# Patient Record
Sex: Female | Born: 1985 | Race: Black or African American | Hispanic: No | Marital: Single | Smoking: Never smoker
Health system: Southern US, Community
[De-identification: ages and names within clinical notes are randomized; demographics above are authoritative.]

## PROBLEM LIST (undated history)

## (undated) DIAGNOSIS — O24419 Gestational diabetes mellitus in pregnancy, unspecified control: Secondary | ICD-10-CM

---
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
## 2006-03-10 ENCOUNTER — Encounter: Admission: RE | Admit: 2006-03-10 | Discharge: 2006-03-10 | Payer: Self-pay | Admitting: Obstetrics

## 2011-10-26 HISTORY — PX: BREAST REDUCTION SURGERY: SHX8

## 2015-12-19 ENCOUNTER — Other Ambulatory Visit: Payer: Self-pay | Admitting: Obstetrics and Gynecology

## 2015-12-19 DIAGNOSIS — N63 Unspecified lump in unspecified breast: Secondary | ICD-10-CM

## 2015-12-25 ENCOUNTER — Ambulatory Visit
Admission: RE | Admit: 2015-12-25 | Discharge: 2015-12-25 | Disposition: A | Discharge status: Home / Self Care | Payer: BLUE CROSS/BLUE SHIELD | Source: Ambulatory Visit | Attending: Obstetrics and Gynecology | Admitting: Obstetrics and Gynecology

## 2015-12-25 ENCOUNTER — Other Ambulatory Visit: Payer: Self-pay | Admitting: Obstetrics and Gynecology

## 2015-12-25 DIAGNOSIS — N63 Unspecified lump in unspecified breast: Secondary | ICD-10-CM

## 2016-01-01 ENCOUNTER — Other Ambulatory Visit: Payer: BLUE CROSS/BLUE SHIELD

## 2016-01-01 ENCOUNTER — Ambulatory Visit
Admission: RE | Admit: 2016-01-01 | Discharge: 2016-01-01 | Disposition: A | Discharge status: Home / Self Care | Payer: BLUE CROSS/BLUE SHIELD | Source: Ambulatory Visit | Attending: Obstetrics and Gynecology | Admitting: Obstetrics and Gynecology

## 2016-01-01 ENCOUNTER — Other Ambulatory Visit: Payer: Self-pay | Admitting: Obstetrics and Gynecology

## 2016-01-01 DIAGNOSIS — N63 Unspecified lump in unspecified breast: Secondary | ICD-10-CM

## 2016-02-23 ENCOUNTER — Other Ambulatory Visit: Payer: Self-pay | Admitting: General Surgery

## 2017-07-04 IMAGING — MG MM DIGITAL DIAGNOSTIC UNILAT*L*
3 series · 3 of 3 positions shown · non-contrast
Comparison: Previous exam(s).

CLINICAL DATA: Evaluate clip placement

EXAM:
DIAGNOSTIC LEFT MAMMOGRAM POST ULTRASOUND BIOPSY

[L ML]
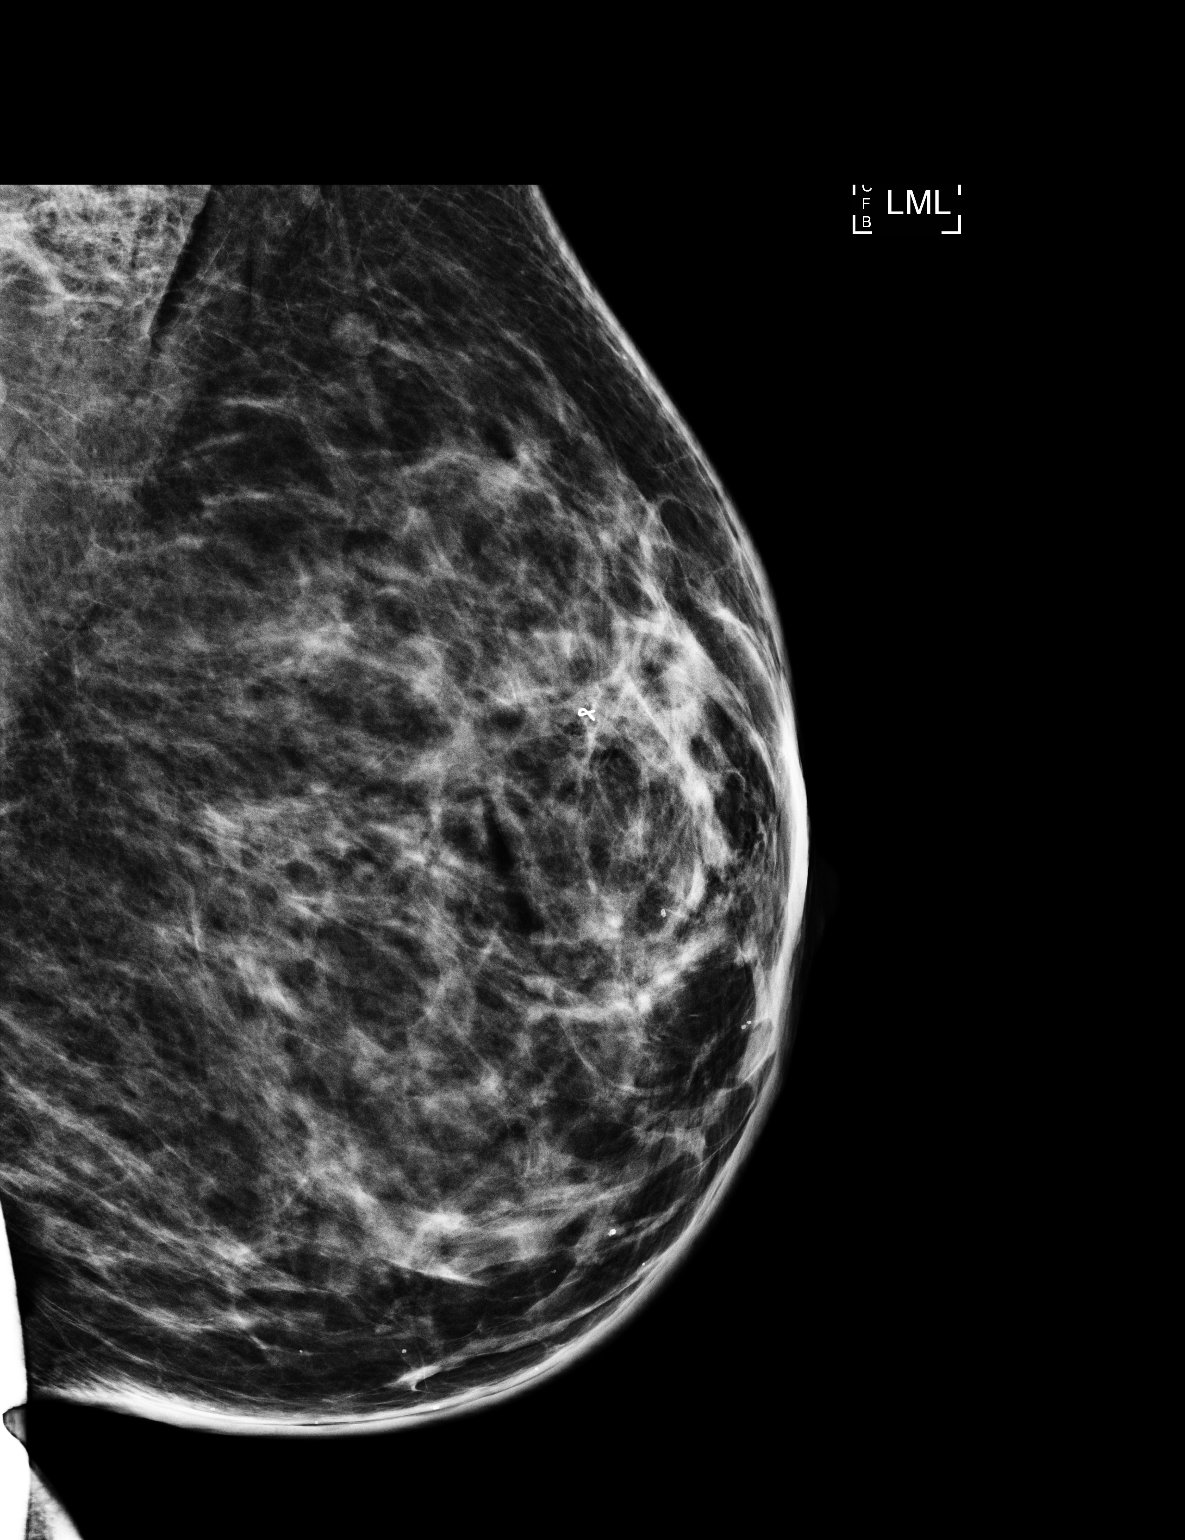

[L MLO]
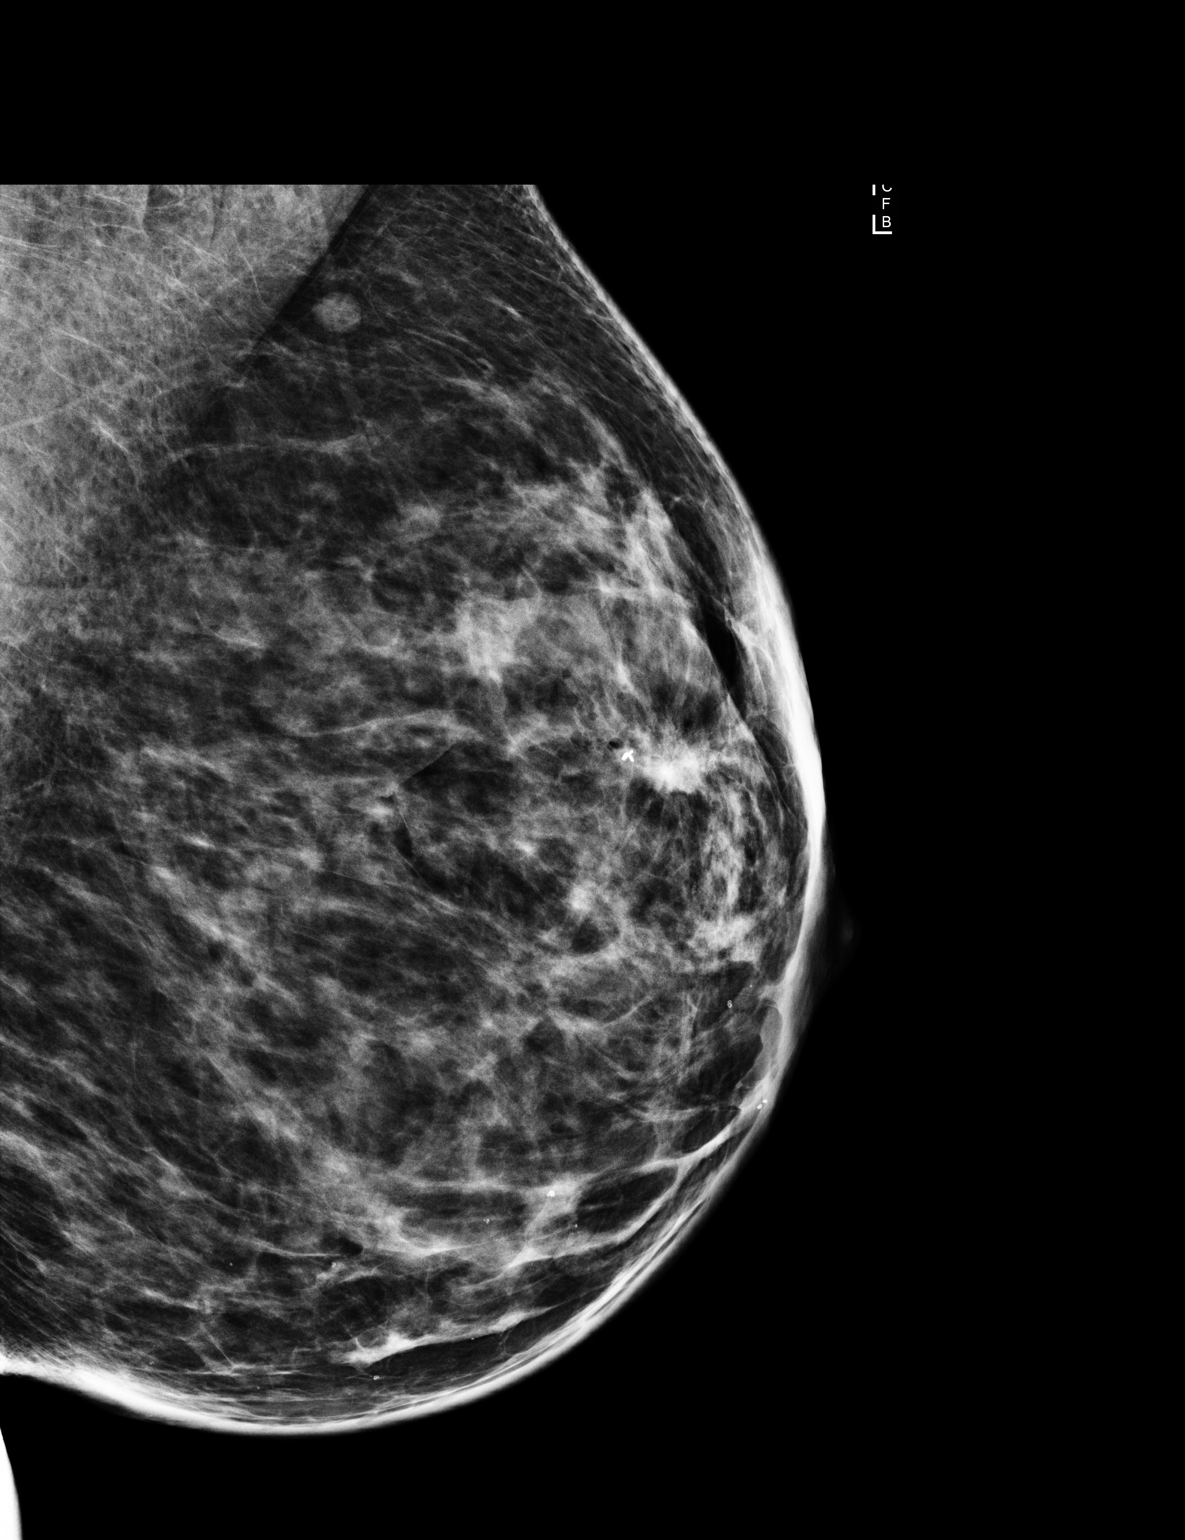

[L CC]
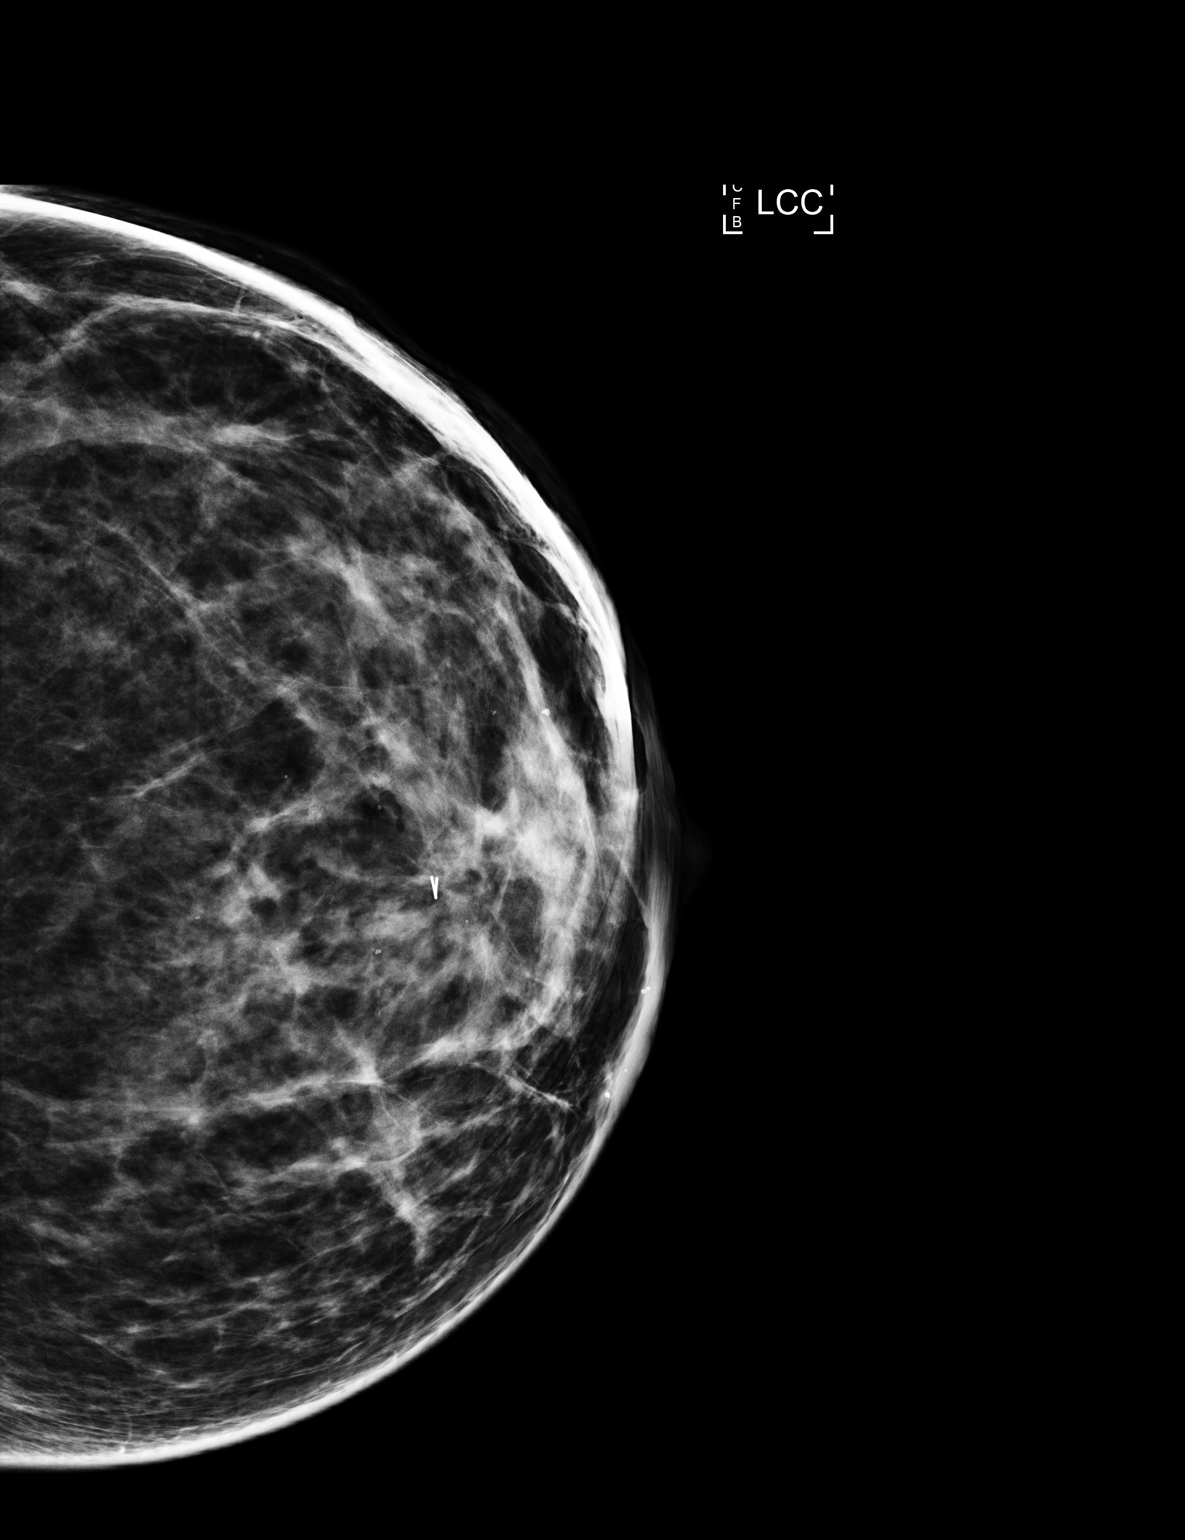

[3 of 3 positions shown; findings below may reference images not displayed]

FINDINGS: Mammographic images were obtained following ultrasound guided biopsy
of a left breast mass with distortion and an abnormal left axillary
node.. The ribbon shaped clip is seen within the left breast mass
and distortion. The biopsied axillary node cannot be pulled into
view.
IMPRESSION: Successful clip placement within the biopsied breast mass. The
biopsied left axillary node cannot be seen on this study. Under
ultrasound guidance, the HydroMARK clip is located within the
periphery of the biopsied node.

Final Assessment: Post Procedure Mammograms for Marker Placement

## 2020-03-03 ENCOUNTER — Encounter (HOSPITAL_COMMUNITY): Payer: Self-pay | Admitting: Emergency Medicine

## 2020-03-03 ENCOUNTER — Emergency Department (HOSPITAL_COMMUNITY)
Admission: EM | Admit: 2020-03-03 | Discharge: 2020-03-04 | Disposition: A | Discharge status: Home / Self Care | Payer: BC Managed Care – PPO | Attending: Emergency Medicine | Admitting: Emergency Medicine

## 2020-03-03 DIAGNOSIS — R112 Nausea with vomiting, unspecified: Secondary | ICD-10-CM | POA: Diagnosis not present

## 2020-03-03 DIAGNOSIS — Z79899 Other long term (current) drug therapy: Secondary | ICD-10-CM | POA: Diagnosis not present

## 2020-03-03 DIAGNOSIS — R1013 Epigastric pain: Secondary | ICD-10-CM | POA: Insufficient documentation

## 2020-03-03 DIAGNOSIS — R197 Diarrhea, unspecified: Secondary | ICD-10-CM | POA: Diagnosis not present

## 2020-03-03 DIAGNOSIS — R109 Unspecified abdominal pain: Secondary | ICD-10-CM

## 2020-03-03 LAB — URINALYSIS, ROUTINE W REFLEX MICROSCOPIC
Bilirubin Urine: NEGATIVE
Glucose, UA: NEGATIVE mg/dL
Hgb urine dipstick: NEGATIVE
Ketones, ur: 80 mg/dL — AB
Leukocytes,Ua: NEGATIVE
Nitrite: NEGATIVE
Protein, ur: 100 mg/dL — AB
Specific Gravity, Urine: 1.027 (ref 1.005–1.030)
pH: 5 (ref 5.0–8.0)

## 2020-03-03 MED ORDER — SODIUM CHLORIDE 0.9% FLUSH: 3.0000 mL | Freq: Once | INTRAVENOUS | Status: DC

## 2020-03-03 MED ORDER — FAMOTIDINE IN NACL 20-0.9 MG/50ML-% IV SOLN
20.0000 mg | Freq: Once | INTRAVENOUS | Status: AC
  Administered 2020-03-04: 20 mg via INTRAVENOUS
  Filled 2020-03-03: qty 50

## 2020-03-03 MED ORDER — ONDANSETRON HCL 4 MG/2ML IJ SOLN
4.0000 mg | Freq: Once | INTRAMUSCULAR | Status: AC
Start: 2020-03-03 — End: 2020-03-04
  Administered 2020-03-04: 01:00:00 4 mg via INTRAVENOUS
  Filled 2020-03-03: qty 2

## 2020-03-03 MED ORDER — SODIUM CHLORIDE 0.9 % IV BOLUS
1000.0000 mL | Freq: Once | INTRAVENOUS | Status: AC
  Administered 2020-03-04: 01:00:00 1000 mL via INTRAVENOUS

## 2020-03-03 NOTE — ED Triage Notes (Signed)
Pt states she woke up this am with epigastric to mid abd pain that started at 3am- Pt did eat out the night before. Pt has had n/v/d. Pt reports 4 episodes of diarrhea and 4 episodes of emesis.

## 2020-03-04 ENCOUNTER — Encounter (HOSPITAL_COMMUNITY): Payer: Self-pay | Admitting: Student

## 2020-03-04 ENCOUNTER — Emergency Department (HOSPITAL_COMMUNITY): Payer: BC Managed Care – PPO

## 2020-03-04 LAB — CBC
HCT: 36.2 % (ref 36.0–46.0)
Hemoglobin: 11.3 g/dL — ABNORMAL LOW (ref 12.0–15.0)
MCH: 24.9 pg — ABNORMAL LOW (ref 26.0–34.0)
MCHC: 31.2 g/dL (ref 30.0–36.0)
MCV: 79.9 fL — ABNORMAL LOW (ref 80.0–100.0)
Platelets: 269 10*3/uL (ref 150–400)
RBC: 4.53 MIL/uL (ref 3.87–5.11)
RDW: 18.5 % — ABNORMAL HIGH (ref 11.5–15.5)
WBC: 12 10*3/uL — ABNORMAL HIGH (ref 4.0–10.5)
nRBC: 0 % (ref 0.0–0.2)

## 2020-03-04 LAB — COMPREHENSIVE METABOLIC PANEL
ALT: 20 U/L (ref 0–44)
AST: 26 U/L (ref 15–41)
Albumin: 4.5 g/dL (ref 3.5–5.0)
Alkaline Phosphatase: 58 U/L (ref 38–126)
Anion gap: 11 (ref 5–15)
BUN: 12 mg/dL (ref 6–20)
CO2: 23 mmol/L (ref 22–32)
Calcium: 9.8 mg/dL (ref 8.9–10.3)
Chloride: 105 mmol/L (ref 98–111)
Creatinine, Ser: 0.85 mg/dL (ref 0.44–1.00)
GFR calc Af Amer: >60 mL/min (ref >60)
GFR calc non Af Amer: >60 mL/min (ref >60)
Glucose, Bld: 127 mg/dL — ABNORMAL HIGH (ref 70–99)
Potassium: 3.8 mmol/L (ref 3.5–5.1)
Sodium: 139 mmol/L (ref 135–145)
Total Bilirubin: 0.8 mg/dL (ref 0.3–1.2)
Total Protein: 8.1 g/dL (ref 6.5–8.1)

## 2020-03-04 LAB — I-STAT BETA HCG BLOOD, ED (MC, WL, AP ONLY): I-stat hCG, quantitative: <5 m[IU]/mL (ref <5)

## 2020-03-04 LAB — LIPASE, BLOOD: Lipase: 29 U/L (ref 11–51)

## 2020-03-04 MED ORDER — ALUM & MAG HYDROXIDE-SIMETH 200-200-20 MG/5ML PO SUSP
30.0000 mL | Freq: Once | ORAL | Status: AC
  Administered 2020-03-04: 01:00:00 30 mL via ORAL
  Filled 2020-03-04: qty 30

## 2020-03-04 MED ORDER — ONDANSETRON 4 MG PO TBDP: 4.0000 mg | ORAL_TABLET | Freq: Three times a day (TID) | ORAL | 0 refills | Status: DC | PRN

## 2020-03-04 MED ORDER — FAMOTIDINE 20 MG PO TABS: 20.0000 mg | ORAL_TABLET | Freq: Two times a day (BID) | ORAL | 0 refills | Status: DC

## 2020-03-04 MED ORDER — LIDOCAINE VISCOUS HCL 2 % MT SOLN
15.0000 mL | Freq: Once | OROMUCOSAL | Status: AC
  Administered 2020-03-04: 01:00:00 15 mL via ORAL
  Filled 2020-03-04: qty 15

## 2020-03-04 MED ORDER — SUCRALFATE 1 GM/10ML PO SUSP: 1.0000 g | Freq: Three times a day (TID) | ORAL | 0 refills | Status: DC

## 2020-03-04 NOTE — Discharge Instructions (Addendum)
You were seen in the emergency department today for abdominal pain with nausea vomiting and diarrhea.  Your work-up was overall reassuring.  Your urine did show that you are dehydrated, please be sure to drink plenty of fluids.  Your ultrasound of your gallbladder was normal.  We suspect your symptoms are likely from a viral illness.  We are sending you home with the following medicines: -Zofran: Take every 8 hours as needed for nausea and vomiting -Pepcid: Take once in the morning and once in the evening to help with stomach acidity/discomfort -Carafate: Take prior to meals and prior to bedtime to help with stomach acidity/discomfort.  We have prescribed you new medication(s) today. Discuss the medications prescribed today with your pharmacist as they can have adverse effects and interactions with your other medicines including over the counter and prescribed medications. Seek medical evaluation if you start to experience new or abnormal symptoms after taking one of these medicines, seek care immediately if you start to experience difficulty breathing, feeling of your throat closing, facial swelling, or rash as these could be indications of a more serious allergic reaction  Please follow attached diet guidelines.  Please follow-up with your primary care provider within 3 days.  Return to the ER for new or worsening symptoms including but not limited to ability to keep fluids down, blood in vomit or stool, worsening pain, or any other concerns.

## 2020-03-04 NOTE — ED Provider Notes (Signed)
[facility] EMERGENCY DEPARTMENT Provider Note   CSN: [id number] Arrival date & time: 03/03/20  1558     History Chief Complaint  Patient presents with  . Abdominal Pain    Jordan Avery is a 34 y.o. female without significant past medical hx who presents to the ED with complaints of abdominal pain that began @ 0200 this AM. Patient reports pain is to the upper abdomen, primarily in the epigastrium, feels like a burning discomfort that has been intermittent since onset. Has had associated chills & N/V/D, with about 3-4 episodes of both emesis & diarrhea, no blood has been noted. She states she feels a bit better with belching. No other specific alleviating/aggravating factors. She was seen at [facility] and there was concern about her gallbladder therefore she was sent to the ED. Denies fever, hematemesis, melena, hematochezia, dysuria, vaginal discharge, or dyspnea. No sick contacts with similar sxs.   HPI     History reviewed. No pertinent past medical history.  There are no problems to display for this patient.   History reviewed. No pertinent surgical history.   OB History   No obstetric history on file.     No family history on file.  Social History   Tobacco Use  . Smoking status: Not on file  Substance Use Topics  . Alcohol use: Not on file  . Drug use: Not on file    Home Medications Prior to Admission medications   Medication Sig Start Date End Date Taking? Authorizing Provider  ibuprofen (ADVIL) 200 MG tablet Take 800 mg by mouth daily as needed for cramping.   Yes [provider]  loratadine (CLARITIN) 10 MG tablet Take 10 mg by mouth daily as needed for allergies or rhinitis.   Yes [provider]  Multiple Vitamin (MULTIVITAMIN WITH MINERALS) TABS tablet Take 1 tablet by mouth daily.   Yes [provider]  tetrahydrozoline 0.05 % ophthalmic solution Place 2 drops into both eyes daily as needed (Dry eyes).   Yes  [provider]    Allergies    Patient has no known allergies.  Review of Systems   Review of Systems  Constitutional: Positive for chills. Negative for fever.  Respiratory: Negative for shortness of breath.   Gastrointestinal: Positive for abdominal pain, diarrhea, nausea and vomiting. Negative for anal bleeding, blood in stool and constipation.  Genitourinary: Negative for dysuria, vaginal bleeding and vaginal discharge.  Neurological: Negative for syncope.  All other systems reviewed and are negative.   Physical Exam Updated Vital Signs BP 130/79 (BP Location: Right Arm)   Pulse 68   Temp 98.4 F (36.9 C) (Oral)   Resp 18   SpO2 98%   Physical Exam Vitals and nursing note reviewed.  Constitutional:      General: She is not in acute distress.    Appearance: She is well-developed. She is not toxic-appearing.  HENT:     Head: Normocephalic and atraumatic.  Eyes:     General:        Right eye: No discharge.        Left eye: No discharge.     Conjunctiva/sclera: Conjunctivae normal.  Cardiovascular:     Rate and Rhythm: Normal rate and regular rhythm.  Pulmonary:     Effort: Pulmonary effort is normal. No respiratory distress.     Breath sounds: Normal breath sounds. No wheezing, rhonchi or rales.  Abdominal:     General: There is no distension.  Palpations: Abdomen is soft.     Tenderness: There is abdominal tenderness (upper abdomen, most prominent in the epigastrium). There is no guarding or rebound.  Musculoskeletal:     Cervical back: Neck supple.  Skin:    General: Skin is warm and dry.     Findings: No rash.  Neurological:     Mental Status: She is alert.     Comments: Clear speech.   Psychiatric:        Behavior: Behavior normal.     ED Results / Procedures / Treatments   Labs (all labs ordered are listed, but only abnormal results are displayed) Labs Reviewed  CBC - Abnormal; Notable for the following components:      Result Value    WBC 12.0 (*)    Hemoglobin 11.3 (*)    MCV 79.9 (*)    MCH 24.9 (*)    RDW 18.5 (*)    All other components within normal limits  URINALYSIS, ROUTINE W REFLEX MICROSCOPIC - Abnormal; Notable for the following components:   APPearance HAZY (*)    Ketones, ur 80 (*)    Protein, ur 100 (*)    Bacteria, UA RARE (*)    All other components within normal limits  LIPASE, BLOOD  COMPREHENSIVE METABOLIC PANEL  I-STAT BETA HCG BLOOD, ED ([facility], [facility], [facility] ONLY)    EKG None  Radiology US Abdomen Limited RUQ  Result Date: 03/04/2020 CLINICAL DATA:  Right upper quadrant and epigastric pain for for for for EXAM: ULTRASOUND ABDOMEN LIMITED RIGHT UPPER QUADRANT COMPARISON:  None. FINDINGS: Gallbladder: No gallstones or wall thickening visualized. No sonographic Murphy sign noted by sonographer. Common bile duct: Diameter: 2.3 mm Liver: No focal lesion identified. Within normal limits in parenchymal echogenicity. Portal vein is patent on color Doppler imaging with normal direction of blood flow towards the liver. Other: None. IMPRESSION: Normal right upper quadrant ultrasound Electronically Signed   By: Jonna Clark M.D.   On: 03/04/2020 00:30    Procedures Procedures (including critical care time)  Medications Ordered in ED Medications  sodium chloride flush (NS) 0.9 % injection 3 mL (has no administration in time range)  sodium chloride 0.9 % bolus 1,000 mL (has no administration in time range)  ondansetron (ZOFRAN) injection 4 mg (has no administration in time range)  famotidine (PEPCID) IVPB 20 mg premix (has no administration in time range)    ED Course  I have reviewed the triage vital signs and the nursing notes.  Pertinent labs & imaging results that were available during my care of the patient were reviewed by me and considered in my medical decision making (see chart for details).     Jordan Avery was evaluated in Emergency Department on 03/04/2020 for the symptoms described in the  history of present illness. He/she was evaluated in the context of the global COVID-19 pandemic, which necessitated consideration that the patient might be at risk for infection with the SARS-CoV-2 virus that causes COVID-19. Institutional protocols and algorithms that pertain to the evaluation of patients at risk for COVID-19 are in a state of rapid change based on information released by regulatory bodies including the CDC and federal and state organizations. These policies and algorithms were followed during the patient's care in the ED.  MDM Rules/Calculators/A&P                     Patient presents to the ED with concern for abdominal discomfort with N/V/D. Patient is nontoxic,  in no apparent distress, vitals WNL. On exam she has some upper abdominal tenderness without peritoneal signs. DDX: GERD, PUD, cholecystitis, cholelithiasis, pancreatitis, viral GI illness, diverticulitis, colitis, obstruction, perforation.  Additional history obtained:  Additional history obtained from review of nursing notes and prior records.  Lab Tests:  I Ordered, reviewed, and interpreted labs, which included:  CBC: Mild leukocytosis at 12.0.  Mild anemia with hemoglobin of 11.3-most recently 11.1 therefore similar to prior. CMP: Renal function & LFTs WNL. No significant electrolyte derangement.  Lipase: WNL Pregnancy test: Negative UA: ketonuria concerning for dehydration, no UTI.   Imaging Studies ordered:  I ordered imaging studies which included RUQ [place], I independently visualized and interpreted imaging which showed no acute process.   ED Course:  On initial evaluation patient given fluids, Zofran, and Pepcid for symptomatic management. Following reassuring work-up and ultrasound was given GI cocktail.  01:55: RE-EVAL: Patient states she is feeling much better, she is tolerating p.o., her repeat abdominal exam remains without peritoneal signs, do not suspect acute surgical process.  Overall suspicion is  for viral GI illness with potentially a component of GERD/PUD.  Will discharge home with supportive care and diet recommendations with PCP follow-up. I discussed results, treatment plan, need for follow-up, and return precautions with the patient. Provided opportunity for questions, patient confirmed understanding and is in agreement with plan.   Portions of this note were generated with Scientist, clinical (histocompatibility and immunogenetics). Dictation errors may occur despite best attempts at proofreading.  Final Clinical Impression(s) / ED Diagnoses Final diagnoses:  Abdominal pain  Nausea vomiting and diarrhea    Rx / DC Orders ED Discharge Orders         Ordered    ondansetron (ZOFRAN ODT) 4 MG disintegrating tablet  Every 8 hours PRN     03/04/20 0200    famotidine (PEPCID) 20 MG tablet  2 times daily     03/04/20 0200    sucralfate (CARAFATE) 1 GM/10ML suspension  3 times daily with meals & bedtime     03/04/20 0200           Petrucelli, Pleas Koch, PA-C 03/04/20 0202    Zadie Rhine, MD 03/04/20 [phone]

## 2020-10-25 NOTE — L&D Delivery Note (Signed)
Delivery Note   Pt was examined and found to be a right rim.  FHR 80-100 but not totally recovering so pt instructed to push and was able to reduce the rim and bring vertex to +2 in one push.  She then pushed well for about 5 minutes and was able to deliver the vertex.  A mild shoulder dystocia was then encountered and resolve in 50 seconds with McRoberts and rotating the posterior shoulder in corkscrew maneuver.  At 11:27 AM a healthy female was delivered via Vaginal, Spontaneous (Presentation:  ROA).  APGAR:8 , 9; weight pending.   Placenta status: Spontaneous, Intact.  Cord: 3 vessels with the following complications: cord present by chest.  Baby moving both arms well.    Anesthesia: Epidural Episiotomy:  none Lacerations:  none Suture Repair:  N/A Est. Blood Loss (mL):   Mom to postpartum.  Baby to Couplet care / Skin to Skin.  D/w pt and they desire circumcision.  Oliver Pila 09/12/2021, 11:42 AM

## 2021-02-19 LAB — OB RESULTS CONSOLE HIV ANTIBODY (ROUTINE TESTING): HIV: NONREACTIVE

## 2021-02-19 LAB — OB RESULTS CONSOLE RPR: RPR: NONREACTIVE

## 2021-02-19 LAB — OB RESULTS CONSOLE VARICELLA ZOSTER ANTIBODY, IGG: Varicella: NON-IMMUNE/NOT IMMUNE

## 2021-02-19 LAB — HEPATITIS C ANTIBODY: HCV Ab: NEGATIVE

## 2021-02-19 LAB — OB RESULTS CONSOLE HEPATITIS B SURFACE ANTIGEN: Hepatitis B Surface Ag: NEGATIVE

## 2021-02-19 LAB — OB RESULTS CONSOLE RUBELLA ANTIBODY, IGM: Rubella: IMMUNE

## 2021-02-19 LAB — OB RESULTS CONSOLE GC/CHLAMYDIA
Chlamydia: NEGATIVE
Gonorrhea: NEGATIVE

## 2021-07-15 ENCOUNTER — Other Ambulatory Visit: Payer: Self-pay

## 2021-07-15 ENCOUNTER — Encounter: Payer: BC Managed Care – PPO | Attending: Obstetrics and Gynecology | Admitting: Registered"

## 2021-07-15 ENCOUNTER — Encounter: Payer: Self-pay | Admitting: Registered"

## 2021-07-15 DIAGNOSIS — O24419 Gestational diabetes mellitus in pregnancy, unspecified control: Secondary | ICD-10-CM | POA: Diagnosis not present

## 2021-07-15 NOTE — Progress Notes (Signed)
Patient was seen on 07/15/21 for Gestational Diabetes self-management class at [facility]. The following learning objectives were met by the patient during this course:  States the definition of Gestational Diabetes States why dietary management is important in controlling blood glucose Describes the effects each nutrient has on blood glucose levels Demonstrates ability to create a balanced meal plan Demonstrates carbohydrate counting  States when to check blood glucose levels Demonstrates proper blood glucose monitoring techniques States the effect of stress and exercise on blood glucose levels States the importance of limiting caffeine and abstaining from alcohol and smoking  Blood glucose monitor given: Patient has meter and is checking blood sugar prior to class   Patient instructed to monitor glucose levels: FBS: 60 - <95; 1 hour: <140; 2 hour: <120  Patient received handouts: Nutrition Diabetes and Pregnancy, including carb counting list  Patient will be seen for follow-up as needed.

## 2021-08-27 LAB — OB RESULTS CONSOLE GBS: GBS: NEGATIVE

## 2021-09-05 IMAGING — US US ABDOMEN LIMITED
1 series · 14 of 25 positions shown · non-contrast
Comparison: None.

CLINICAL DATA: Right upper quadrant and epigastric pain for for for
for

EXAM:
ULTRASOUND ABDOMEN LIMITED RIGHT UPPER QUADRANT

[Series 1: us abdomen limited ruq · 14 of 44 slices shown]
[im 1/44]
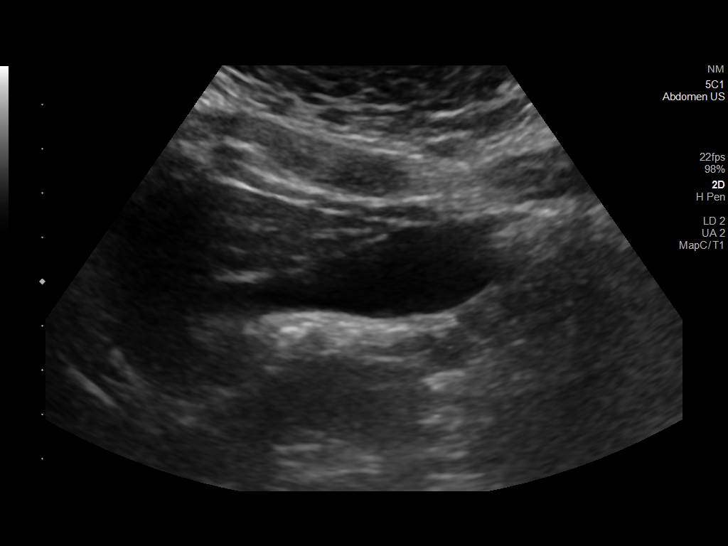
[im 4/44]
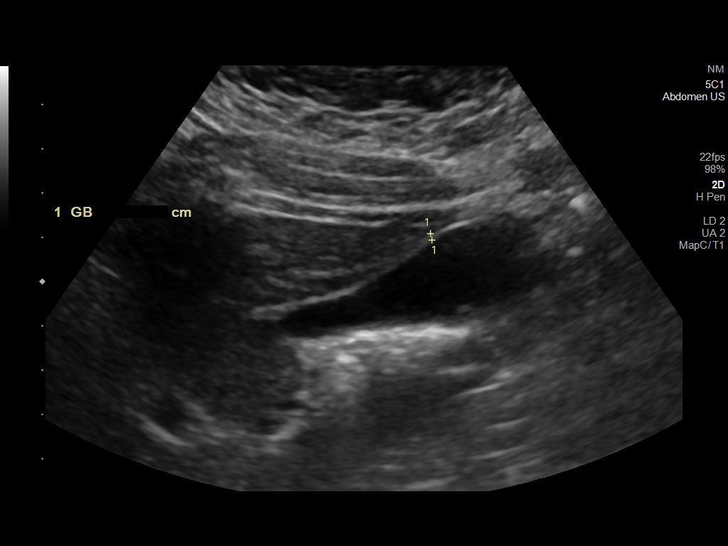
[im 8/44]
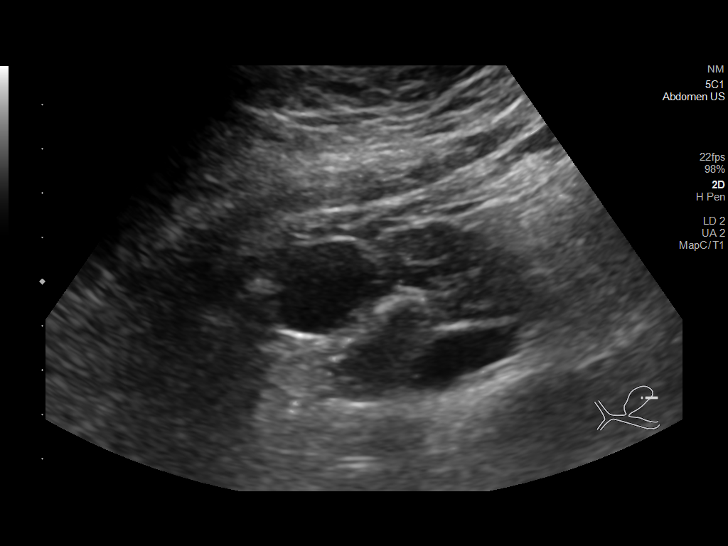
[im 11/44]
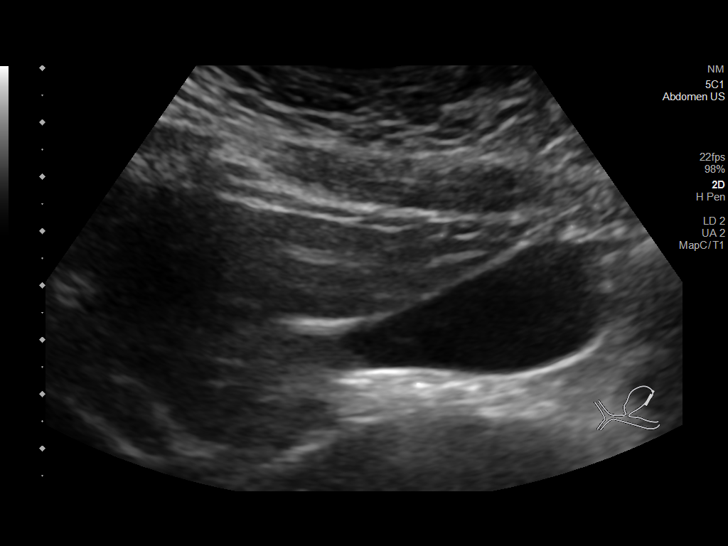
[im 15/44]
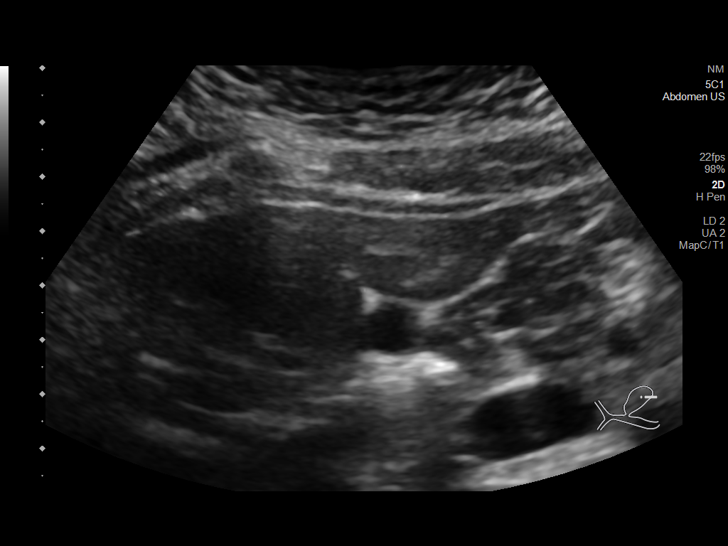
[im 17/44]
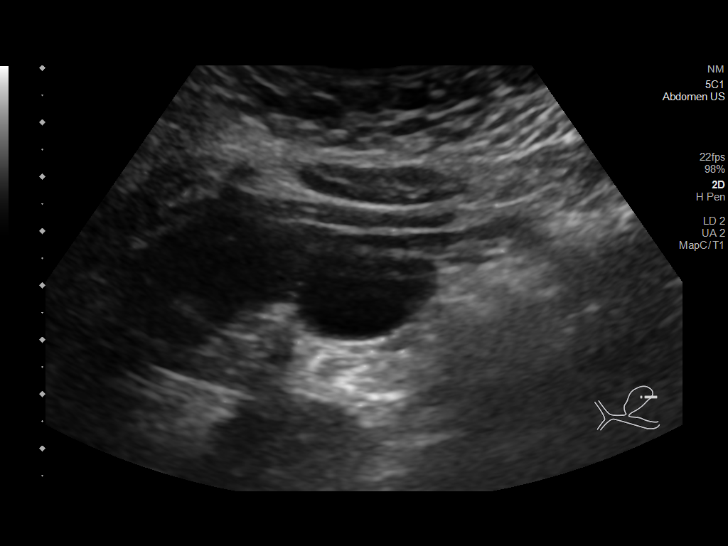
[im 20/44]
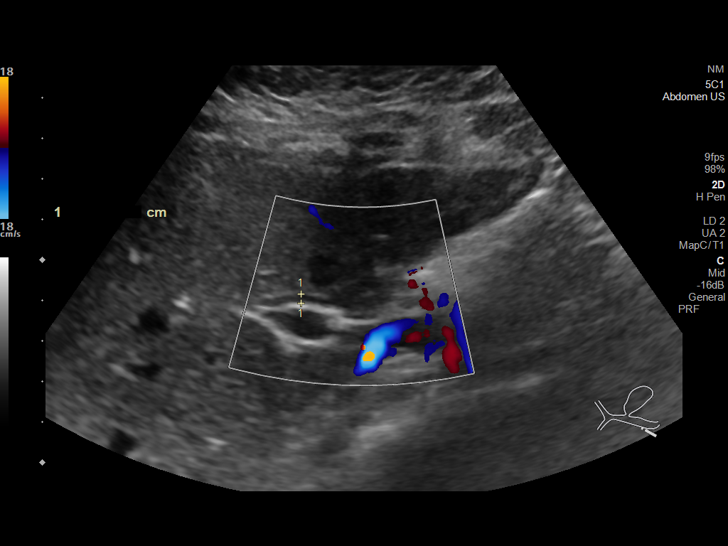
[im 24/44]
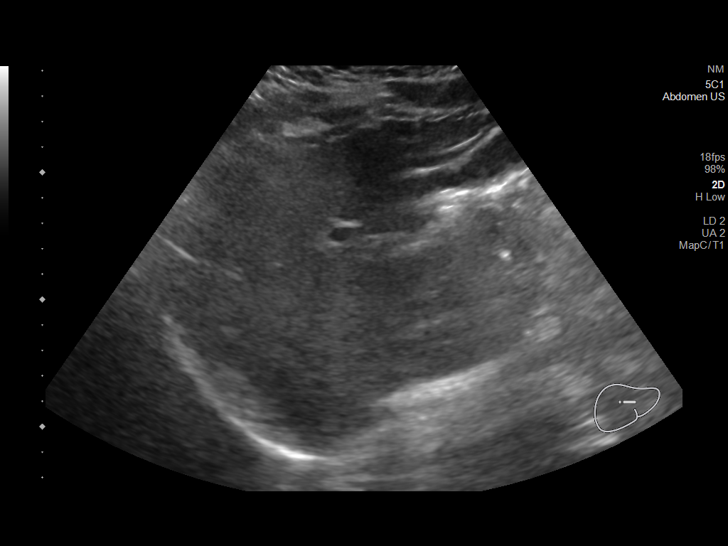
[im 27/44]
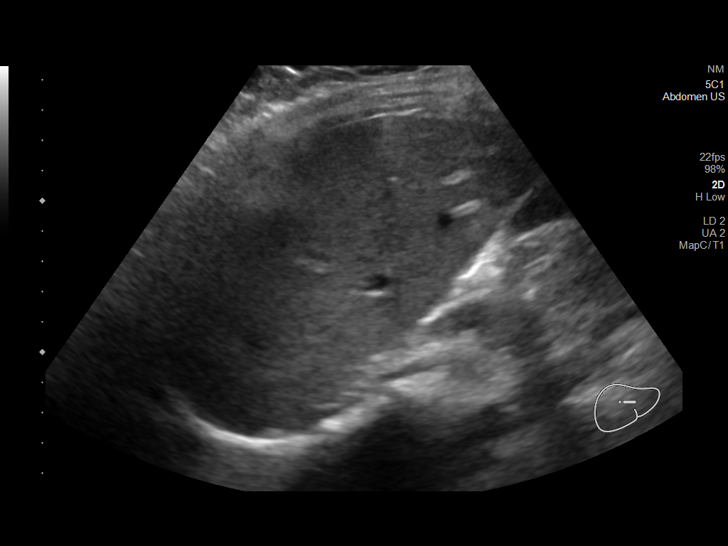
[im 29/44]
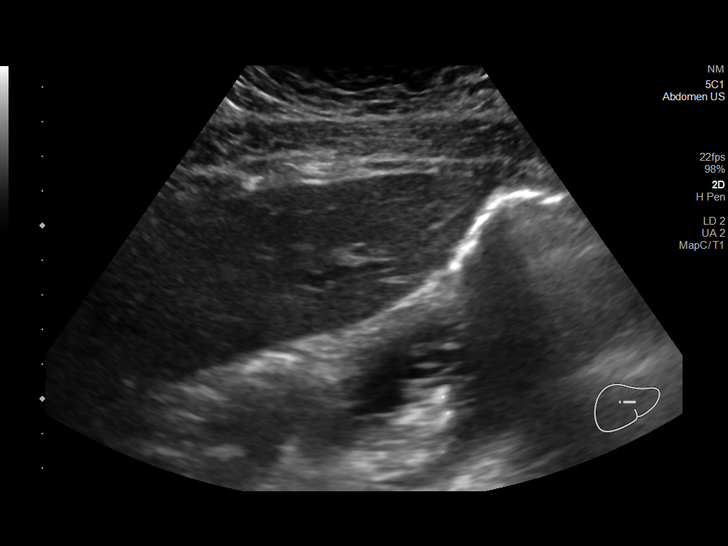
[im 33/44]
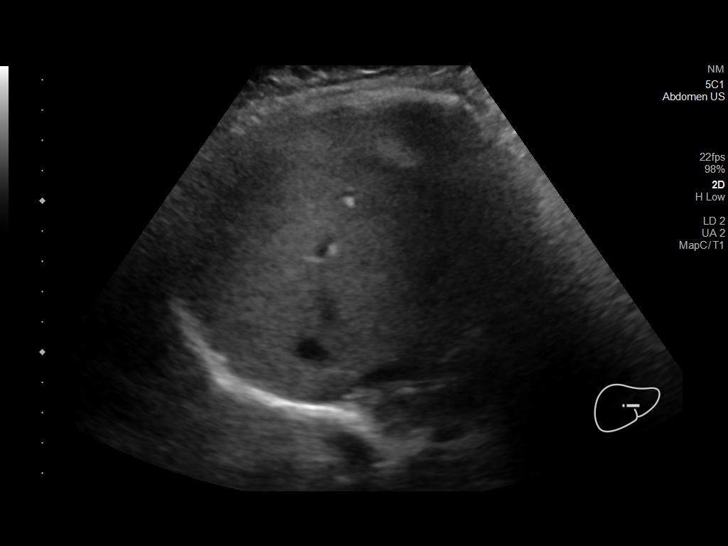
[im 36/44]
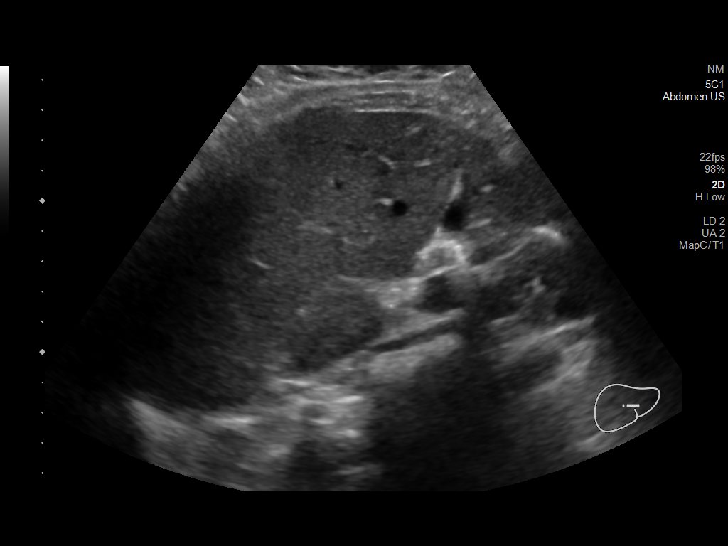
[im 40/44]
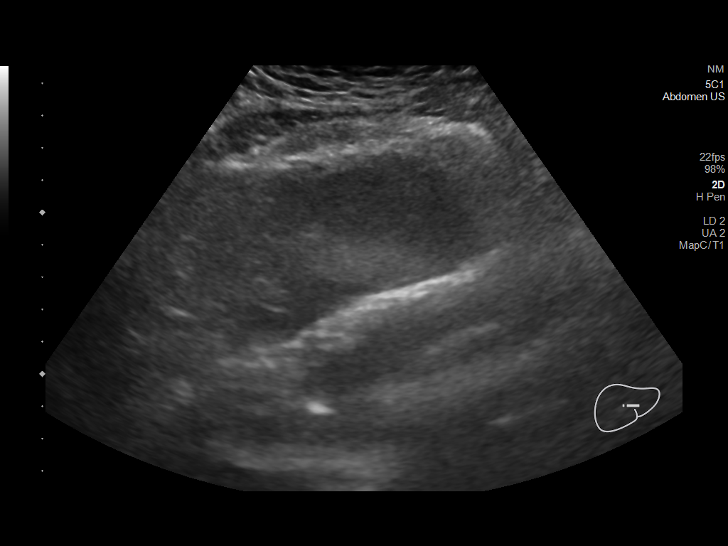
[im 44/44]
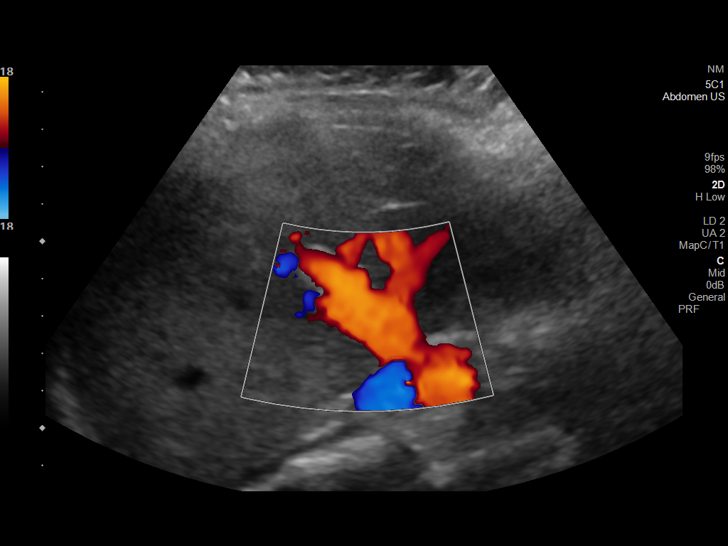

[14 of 25 positions shown; findings below may reference images not displayed]

FINDINGS: Gallbladder:

No gallstones or wall thickening visualized. No sonographic Murphy
sign noted by sonographer.

Common bile duct:

Diameter: 2.3 mm

Liver:

No focal lesion identified. Within normal limits in parenchymal
echogenicity. Portal vein is patent on color Doppler imaging with
normal direction of blood flow towards the liver.

Other: None.
IMPRESSION: Normal right upper quadrant ultrasound

## 2021-09-11 ENCOUNTER — Inpatient Hospital Stay (HOSPITAL_COMMUNITY)
Admission: AD | Admit: 2021-09-11 | Discharge: 2021-09-14 | DRG: 806 | Disposition: A | Discharge status: Home / Self Care | Payer: BC Managed Care – PPO | Attending: Obstetrics and Gynecology | Admitting: Obstetrics and Gynecology

## 2021-09-11 ENCOUNTER — Other Ambulatory Visit: Payer: Self-pay

## 2021-09-11 ENCOUNTER — Encounter (HOSPITAL_COMMUNITY): Payer: Self-pay | Admitting: *Deleted

## 2021-09-11 DIAGNOSIS — O24425 Gestational diabetes mellitus in childbirth, controlled by oral hypoglycemic drugs: Principal | ICD-10-CM | POA: Diagnosis present

## 2021-09-11 DIAGNOSIS — O26893 Other specified pregnancy related conditions, third trimester: Secondary | ICD-10-CM | POA: Diagnosis present

## 2021-09-11 DIAGNOSIS — O4103X0 Oligohydramnios, third trimester, not applicable or unspecified: Secondary | ICD-10-CM | POA: Diagnosis present

## 2021-09-11 DIAGNOSIS — D509 Iron deficiency anemia, unspecified: Secondary | ICD-10-CM | POA: Diagnosis present

## 2021-09-11 DIAGNOSIS — O9902 Anemia complicating childbirth: Secondary | ICD-10-CM | POA: Diagnosis present

## 2021-09-11 DIAGNOSIS — Z3A38 38 weeks gestation of pregnancy: Secondary | ICD-10-CM | POA: Diagnosis not present

## 2021-09-11 DIAGNOSIS — Z20822 Contact with and (suspected) exposure to covid-19: Secondary | ICD-10-CM | POA: Diagnosis present

## 2021-09-11 DIAGNOSIS — O289 Unspecified abnormal findings on antenatal screening of mother: Principal | ICD-10-CM | POA: Diagnosis present

## 2021-09-11 HISTORY — DX: Gestational diabetes mellitus in pregnancy, unspecified control: O24.419

## 2021-09-11 LAB — COMPREHENSIVE METABOLIC PANEL
ALT: 11 U/L (ref 0–44)
AST: 20 U/L (ref 15–41)
Albumin: 2.8 g/dL — ABNORMAL LOW (ref 3.5–5.0)
Alkaline Phosphatase: 143 U/L — ABNORMAL HIGH (ref 38–126)
Anion gap: 9 (ref 5–15)
BUN: 8 mg/dL (ref 6–20)
CO2: 19 mmol/L — ABNORMAL LOW (ref 22–32)
Calcium: 9.2 mg/dL (ref 8.9–10.3)
Chloride: 106 mmol/L (ref 98–111)
Creatinine, Ser: 0.69 mg/dL (ref 0.44–1.00)
GFR, Estimated: >60 mL/min (ref >60)
Glucose, Bld: 131 mg/dL — ABNORMAL HIGH (ref 70–99)
Potassium: 3.9 mmol/L (ref 3.5–5.1)
Sodium: 134 mmol/L — ABNORMAL LOW (ref 135–145)
Total Bilirubin: 0.3 mg/dL (ref 0.3–1.2)
Total Protein: 6.4 g/dL — ABNORMAL LOW (ref 6.5–8.1)

## 2021-09-11 LAB — RESP PANEL BY RT-PCR (FLU A&B, COVID) ARPGX2
Influenza A by PCR: NEGATIVE
Influenza B by PCR: NEGATIVE
SARS Coronavirus 2 by RT PCR: NEGATIVE

## 2021-09-11 LAB — PROTEIN / CREATININE RATIO, URINE
Creatinine, Urine: 181.06 mg/dL
Protein Creatinine Ratio: 0.25 mg/mg{Cre} — ABNORMAL HIGH (ref 0.00–0.15)
Total Protein, Urine: 45 mg/dL

## 2021-09-11 LAB — GLUCOSE, CAPILLARY
Glucose-Capillary: 86 mg/dL (ref 70–99)
Glucose-Capillary: 100 mg/dL — ABNORMAL HIGH (ref 70–99)

## 2021-09-11 LAB — CBC
HCT: 36.2 % (ref 36.0–46.0)
Hemoglobin: 12.2 g/dL (ref 12.0–15.0)
MCH: 27.5 pg (ref 26.0–34.0)
MCHC: 33.7 g/dL (ref 30.0–36.0)
MCV: 81.5 fL (ref 80.0–100.0)
Platelets: 192 10*3/uL (ref 150–400)
RBC: 4.44 MIL/uL (ref 3.87–5.11)
RDW: 14.9 % (ref 11.5–15.5)
WBC: 6.1 10*3/uL (ref 4.0–10.5)
nRBC: 0 % (ref 0.0–0.2)

## 2021-09-11 LAB — TYPE AND SCREEN
ABO/RH(D): B POS
Antibody Screen: NEGATIVE

## 2021-09-11 MED ORDER — TERBUTALINE SULFATE 1 MG/ML IJ SOLN: 0.2500 mg | Freq: Once | INTRAMUSCULAR | Status: DC | PRN

## 2021-09-11 MED ORDER — OXYTOCIN BOLUS FROM INFUSION
333.0000 mL | Freq: Once | INTRAVENOUS | Status: AC
  Administered 2021-09-12: 333 mL via INTRAVENOUS

## 2021-09-11 MED ORDER — LACTATED RINGERS IV SOLN: INTRAVENOUS | Status: DC

## 2021-09-11 MED ORDER — LIDOCAINE HCL (PF) 1 % IJ SOLN: 30.0000 mL | INTRAMUSCULAR | Status: DC | PRN

## 2021-09-11 MED ORDER — ACETAMINOPHEN 325 MG PO TABS: 650.0000 mg | ORAL_TABLET | ORAL | Status: DC | PRN

## 2021-09-11 MED ORDER — OXYCODONE-ACETAMINOPHEN 5-325 MG PO TABS: 1.0000 | ORAL_TABLET | ORAL | Status: DC | PRN

## 2021-09-11 MED ORDER — OXYTOCIN-SODIUM CHLORIDE 30-0.9 UT/500ML-% IV SOLN
2.5000 [IU]/h | INTRAVENOUS | Status: DC
  Filled 2021-09-11: qty 500

## 2021-09-11 MED ORDER — MISOPROSTOL 25 MCG QUARTER TABLET
25.0000 ug | ORAL_TABLET | ORAL | Status: DC | PRN
  Administered 2021-09-11 (×2): 25 ug via VAGINAL
  Filled 2021-09-11 (×3): qty 1

## 2021-09-11 MED ORDER — SOD CITRATE-CITRIC ACID 500-334 MG/5ML PO SOLN: 30.0000 mL | ORAL | Status: DC | PRN

## 2021-09-11 MED ORDER — ONDANSETRON HCL 4 MG/2ML IJ SOLN: 4.0000 mg | Freq: Four times a day (QID) | INTRAMUSCULAR | Status: DC | PRN

## 2021-09-11 MED ORDER — INSULIN ASPART 100 UNIT/ML IJ SOLN: 0.0000 [IU] | INTRAMUSCULAR | Status: DC

## 2021-09-11 MED ORDER — OXYCODONE-ACETAMINOPHEN 5-325 MG PO TABS: 2.0000 | ORAL_TABLET | ORAL | Status: DC | PRN

## 2021-09-11 MED ORDER — LACTATED RINGERS IV SOLN: 500.0000 mL | INTRAVENOUS | Status: DC | PRN

## 2021-09-11 MED ORDER — FENTANYL CITRATE (PF) 100 MCG/2ML IJ SOLN
50.0000 ug | INTRAMUSCULAR | Status: DC | PRN
  Administered 2021-09-12: 100 ug via INTRAVENOUS
  Filled 2021-09-11: qty 2

## 2021-09-11 NOTE — MAU Note (Signed)
Pt sent from MFM for low fluid and decreased fetal movement a direct admit. No beds on L&D at this time. Will monitor until a bed becomes available. Reports occasion mild ct denies any vag bleeding or leaking.

## 2021-09-11 NOTE — H&P (Addendum)
Jordan Avery is a 35 y.o. female G20P0101 [redacted]w[redacted]d sent from [facility] office for [facility] 4/8 (-2 movement, -2 tone), with borderline AFI (5cm, MVP 3.2cm) during routine antenatal testing for AMA, A2GDM. SVE in office by Dr. Reina Fuse 1-2/50/-3 She reports good FM. No contractions, LOF, or VB. No HA, vision changes, RUQ pain.   Continuous fetal monitoring in MAU was reactive.   Pregnancy c/b: A2GDM: sugars controlled on metformin 1000mg  qhs Advanced maternal age: declined aneuploidy screening History of depression: previously on bupropion, not on meds during pregnancy Iron deficiency anemia of pregnancy: most recent Hgb 10.1 on 06/22/21, taking PO iron Isolated EIF on anatomy scan History of cholestasis in last pregnancy, delivered at 37 weeks (6lb 2oz)  OB History     Gravida  2   Para  1   Term  0   Preterm  1   AB      Living  1      SAB      IAB      Ectopic      Multiple      Live Births  1          Past Medical History:  Diagnosis Date   Gestational diabetes    Past Surgical History:  Procedure Laterality Date   BREAST REDUCTION SURGERY  2013   Family History: family history is not on file. Social History:  reports that she has never smoked. She has never used smokeless tobacco. She reports that she does not currently use alcohol. She reports that she does not use drugs.     Maternal Diabetes: Yes:  Diabetes Type:  Insulin/Medication controlled Genetic Screening: Declined Maternal Ultrasounds/Referrals: Isolated EIF (echogenic intracardiac focus) Fetal Ultrasounds or other Referrals:  None Maternal Substance Abuse:  No Significant Maternal Medications:  Meds include: Other: metformin Significant Maternal Lab Results:  Group B Strep negative Other Comments:  None  Review of Systems Per HPI Exam Physical Exam    Blood pressure 122/89, pulse 90, temperature 97.7 F (36.5 C), temperature source Oral, resp. rate 17, height 5\' 7"  (1.702 m), weight 84.8  kg. Gen: NAD, resting comfortably CVS: normal pulses Lungs: nonlabored respirations Abd: Gravid abdomen, Leopolds 8# Ext: no calf edema or tenderness  Fetal testing: 120bpm, moderate variability, + accels, no decels Toco: rare contractions Prenatal labs: ABO, Rh:  --/--/B POS (11/18 1321) Antibody: NEG (11/18 1321) Rubella: Immune (04/28 0000) RPR: Nonreactive (04/28 0000)  HBsAg: Negative (04/28 0000)  HIV: Non-reactive (04/28 0000)  GBS: Negative/-- (11/03 0000)   Assessment/Plan: 34Y G2P1001 @ [redacted]w[redacted]d, IOL for BPP 4/8 on routine antenatal testing for AMA, GDMA2 Fetal wellbeing: cat I tracing IOL: given > 2 hours of reactive tracing, will plan cytotec 07-18-1993 PV. Patient prefers to avoid foley balloon. Pelvis proven to 6lb2oz, discussed with patient this baby feels significantly larger (Leopolds at least 8lbs).  A2GDM: monitor BG, ISS per protocol Pain control: epidural upon patient request Mild range BP in MAU, no elevated Bps in prenatal care: will check labs  [redacted]w[redacted]d 09/11/2021, 4:49 PM

## 2021-09-12 ENCOUNTER — Encounter (HOSPITAL_COMMUNITY): Payer: Self-pay | Admitting: Obstetrics and Gynecology

## 2021-09-12 ENCOUNTER — Inpatient Hospital Stay (HOSPITAL_COMMUNITY): Payer: BC Managed Care – PPO | Admitting: Anesthesiology

## 2021-09-12 LAB — GLUCOSE, CAPILLARY
Glucose-Capillary: 152 mg/dL — ABNORMAL HIGH (ref 70–99)
Glucose-Capillary: 74 mg/dL (ref 70–99)
Glucose-Capillary: 84 mg/dL (ref 70–99)

## 2021-09-12 LAB — RPR: RPR Ser Ql: NONREACTIVE

## 2021-09-12 MED ORDER — IBUPROFEN 600 MG PO TABS
600.0000 mg | ORAL_TABLET | Freq: Four times a day (QID) | ORAL | Status: DC
  Administered 2021-09-12 – 2021-09-14 (×8): 600 mg via ORAL
  Filled 2021-09-12 (×8): qty 1

## 2021-09-12 MED ORDER — ONDANSETRON HCL 4 MG PO TABS: 4.0000 mg | ORAL_TABLET | ORAL | Status: DC | PRN

## 2021-09-12 MED ORDER — ACETAMINOPHEN 325 MG PO TABS: 650.0000 mg | ORAL_TABLET | ORAL | Status: DC | PRN

## 2021-09-12 MED ORDER — SIMETHICONE 80 MG PO CHEW: 80.0000 mg | CHEWABLE_TABLET | ORAL | Status: DC | PRN

## 2021-09-12 MED ORDER — SENNOSIDES-DOCUSATE SODIUM 8.6-50 MG PO TABS
2.0000 | ORAL_TABLET | ORAL | Status: DC
  Administered 2021-09-13: 2 via ORAL
  Filled 2021-09-12: qty 2

## 2021-09-12 MED ORDER — DIPHENHYDRAMINE HCL 50 MG/ML IJ SOLN: 12.5000 mg | INTRAMUSCULAR | Status: DC | PRN

## 2021-09-12 MED ORDER — DIBUCAINE (PERIANAL) 1 % EX OINT: 1.0000 "application " | TOPICAL_OINTMENT | CUTANEOUS | Status: DC | PRN

## 2021-09-12 MED ORDER — PHENYLEPHRINE 40 MCG/ML (10ML) SYRINGE FOR IV PUSH (FOR BLOOD PRESSURE SUPPORT): 80.0000 ug | PREFILLED_SYRINGE | INTRAVENOUS | Status: DC | PRN

## 2021-09-12 MED ORDER — DIPHENHYDRAMINE HCL 25 MG PO CAPS: 25.0000 mg | ORAL_CAPSULE | Freq: Four times a day (QID) | ORAL | Status: DC | PRN

## 2021-09-12 MED ORDER — COCONUT OIL OIL: 1.0000 "application " | TOPICAL_OIL | Status: DC | PRN

## 2021-09-12 MED ORDER — EPHEDRINE 5 MG/ML INJ: 10.0000 mg | INTRAVENOUS | Status: DC | PRN

## 2021-09-12 MED ORDER — ZOLPIDEM TARTRATE 5 MG PO TABS: 5.0000 mg | ORAL_TABLET | Freq: Every evening | ORAL | Status: DC | PRN

## 2021-09-12 MED ORDER — LACTATED RINGERS IV SOLN
500.0000 mL | Freq: Once | INTRAVENOUS | Status: AC
  Administered 2021-09-12: 500 mL via INTRAVENOUS

## 2021-09-12 MED ORDER — TETANUS-DIPHTH-ACELL PERTUSSIS 5-2.5-18.5 LF-MCG/0.5 IM SUSY: 0.5000 mL | PREFILLED_SYRINGE | Freq: Once | INTRAMUSCULAR | Status: DC

## 2021-09-12 MED ORDER — OXYTOCIN-SODIUM CHLORIDE 30-0.9 UT/500ML-% IV SOLN
1.0000 m[IU]/min | INTRAVENOUS | Status: DC
  Administered 2021-09-12: 2 m[IU]/min via INTRAVENOUS

## 2021-09-12 MED ORDER — LIDOCAINE-EPINEPHRINE (PF) 2 %-1:200000 IJ SOLN
INTRAMUSCULAR | Status: DC | PRN
  Administered 2021-09-12: 5 mL via EPIDURAL

## 2021-09-12 MED ORDER — PHENYLEPHRINE 40 MCG/ML (10ML) SYRINGE FOR IV PUSH (FOR BLOOD PRESSURE SUPPORT)
80.0000 ug | PREFILLED_SYRINGE | INTRAVENOUS | Status: DC | PRN
  Filled 2021-09-12: qty 10

## 2021-09-12 MED ORDER — BENZOCAINE-MENTHOL 20-0.5 % EX AERO: 1.0000 "application " | INHALATION_SPRAY | CUTANEOUS | Status: DC | PRN

## 2021-09-12 MED ORDER — FENTANYL-BUPIVACAINE-NACL 0.5-0.125-0.9 MG/250ML-% EP SOLN
12.0000 mL/h | EPIDURAL | Status: DC | PRN
  Administered 2021-09-12: 12 mL/h via EPIDURAL
  Filled 2021-09-12: qty 250

## 2021-09-12 MED ORDER — PRENATAL MULTIVITAMIN CH
1.0000 | ORAL_TABLET | Freq: Every day | ORAL | Status: DC
  Administered 2021-09-13 – 2021-09-14 (×2): 1 via ORAL
  Filled 2021-09-12 (×2): qty 1

## 2021-09-12 MED ORDER — INSULIN ASPART 100 UNIT/ML IJ SOLN
0.0000 [IU] | INTRAMUSCULAR | Status: DC
  Administered 2021-09-12: 2 [IU] via SUBCUTANEOUS

## 2021-09-12 MED ORDER — WITCH HAZEL-GLYCERIN EX PADS: 1.0000 "application " | MEDICATED_PAD | CUTANEOUS | Status: DC | PRN

## 2021-09-12 MED ORDER — TERBUTALINE SULFATE 1 MG/ML IJ SOLN: 0.2500 mg | Freq: Once | INTRAMUSCULAR | Status: DC | PRN

## 2021-09-12 MED ORDER — ONDANSETRON HCL 4 MG/2ML IJ SOLN: 4.0000 mg | INTRAMUSCULAR | Status: DC | PRN

## 2021-09-12 NOTE — Anesthesia Procedure Notes (Signed)
Epidural Patient location during procedure: OB Start time: 09/12/2021 9:05 AM End time: 09/12/2021 9:15 AM  Staffing Anesthesiologist: Elmer Picker, MD Performed: anesthesiologist   Preanesthetic Checklist Completed: patient identified, IV checked, risks and benefits discussed, monitors and equipment checked, pre-op evaluation and timeout performed  Epidural Patient position: sitting Prep: DuraPrep and site prepped and draped Patient monitoring: continuous pulse ox, blood pressure, heart rate and cardiac monitor Approach: midline Location: L3-L4 Injection technique: LOR air  Needle:  Needle type: Tuohy  Needle gauge: 17 G Needle length: 9 cm Needle insertion depth: 5 cm Catheter type: closed end flexible Catheter size: 19 Gauge Catheter at skin depth: 10 cm Test dose: negative  Assessment Sensory level: T8 Events: blood not aspirated, injection not painful, no injection resistance, no paresthesia and negative IV test  Additional Notes Patient identified. Risks/Benefits/Options discussed with patient including but not limited to bleeding, infection, nerve damage, paralysis, failed block, incomplete pain control, headache, blood pressure changes, nausea, vomiting, reactions to medication both or allergic, itching and postpartum back pain. Confirmed with bedside nurse the patient's most recent platelet count. Confirmed with patient that they are not currently taking any anticoagulation, have any bleeding history or any family history of bleeding disorders. Patient expressed understanding and wished to proceed. All questions were answered. Sterile technique was used throughout the entire procedure. Please see nursing notes for vital signs. Test dose was given through epidural catheter and negative prior to continuing to dose epidural or start infusion. Warning signs of high block given to the patient including shortness of breath, tingling/numbness in hands, complete motor block,  or any concerning symptoms with instructions to call for help. Patient was given instructions on fall risk and not to get out of bed. All questions and concerns addressed with instructions to call with any issues or inadequate analgesia.  Reason for block:procedure for pain

## 2021-09-12 NOTE — Lactation Note (Addendum)
This note was copied from a baby's chart. Lactation Consultation Note  Patient Name: Boy Evonne Rinks [id number] Date: 09/12/2021 Reason for consult: L&D Initial assessment;Breast reduction;1st time breastfeeding;Maternal endocrine disorder (2013) Age:36 hours  Mother states she had difficulty with breastfeeding her other child.  Had reduction in 2013.  Assisted with latching off and on.  Lactation to follow up in [facility].   LATCH Score Latch: Repeated attempts needed to sustain latch, nipple held in mouth throughout feeding, stimulation needed to elicit sucking reflex.  Audible Swallowing: A few with stimulation  Type of Nipple: Everted at rest and after stimulation (short shaft)  Comfort (Breast/Nipple): Soft / non-tender  Hold (Positioning): Assistance needed to correctly position infant at breast and maintain latch.  LATCH Score: 7  Interventions Interventions: Assisted with latch;Skin to skin;Education  Consult Status Consult Status: Follow-up from L&D    [name] [facility] 09/12/2021, 1:43 PM

## 2021-09-12 NOTE — Lactation Note (Signed)
This note was copied from a baby's chart. Lactation Consultation Note  Patient Name: Jordan Avery [id number] Date: 09/12/2021 Reason for consult: L&D Initial assessment Age:36 hours  FOB holding baby STS. Mother states she had already tried to latch with no success.  She states she has flat inverted nipples and had breast reduction in 2013. Requested assistance later today.       Consult Status Consult Status: Follow-up from L&D    [name] [facility] 09/12/2021, 12:15 PM

## 2021-09-12 NOTE — Progress Notes (Signed)
Patient ID: Jordan Avery, female   DOB: 11-Nov-1985, 35 y.o.   MRN: [id number] Pt getting more uncomfortable, coping ok  Afeb VSS FHR overall category 1 with occasional mild variables  Cervix 70/4+/-2  AROM clear  D/w pt plan of care  Epidural whenever desires

## 2021-09-12 NOTE — Anesthesia Preprocedure Evaluation (Signed)
Anesthesia Evaluation  Patient identified by MRN, date of birth, ID band Patient awake    Reviewed: Allergy & Precautions, NPO status , Patient's Chart, lab work & pertinent test results  Airway Mallampati: II  TM Distance: >3 FB Neck ROM: Full    Dental no notable dental hx.    Pulmonary neg pulmonary ROS,    Pulmonary exam normal breath sounds clear to auscultation       Cardiovascular negative cardio ROS Normal cardiovascular exam Rhythm:Regular Rate:Normal     Neuro/Psych negative neurological ROS  negative psych ROS   GI/Hepatic negative GI ROS, Neg liver ROS,   Endo/Other  diabetes, Gestational, Oral Hypoglycemic Agents  Renal/GU negative Renal ROS  negative genitourinary   Musculoskeletal negative musculoskeletal ROS (+)   Abdominal   Peds  Hematology negative hematology ROS (+)   Anesthesia Other Findings IOL for BPP 4/8 on routine antenatal testing for AMA, GDMA2  Reproductive/Obstetrics (+) Pregnancy                             Anesthesia Physical Anesthesia Plan  ASA: 3  Anesthesia Plan: Epidural   Post-op Pain Management:    Induction:   PONV Risk Score and Plan: Treatment may vary due to age or medical condition  Airway Management Planned: Natural Airway  Additional Equipment:   Intra-op Plan:   Post-operative Plan:   Informed Consent: I have reviewed the patients History and Physical, chart, labs and discussed the procedure including the risks, benefits and alternatives for the proposed anesthesia with the patient or authorized representative who has indicated his/her understanding and acceptance.       Plan Discussed with: Anesthesiologist  Anesthesia Plan Comments: (Patient identified. Risks, benefits, options discussed with patient including but not limited to bleeding, infection, nerve damage, paralysis, failed block, incomplete pain control, headache,  blood pressure changes, nausea, vomiting, reactions to medication, itching, and post partum back pain. Confirmed with bedside nurse the patient's most recent platelet count. Confirmed with the patient that they are not taking any anticoagulation, have any bleeding history or any family history of bleeding disorders. Patient expressed understanding and wishes to proceed. All questions were answered. )        Anesthesia Quick Evaluation

## 2021-09-12 NOTE — Lactation Note (Signed)
This note was copied from a baby's chart. Lactation Consultation Note  Patient Name: Jordan Avery [id number] Date: 09/12/2021 Reason for consult: Initial assessment;Early term 37-38.6wks;Breast reduction Age:36 hours  Initial visit to 9 hours old infant of a P2 mother. Mother states she had a breast reduction in 2013 but experienced breast changes with this pregnancy. Mother reports challenges with breastfeeding her first child including using a nipple shield. LC offered assistance with latch. Demonstrated hand expression, unable to express any colostrum. Provided hand pump for nipple eversion and colostrum expression, observed 1 drop. Infant unable to latch after several attempts. Mother bottlefed 15 mL of donor milk. LC talked about feeding volume and pacing.  Discussed normal newborn behavior and patterns, signs of good milk transfer, hunger cues, tummy size and benefits of skin to skin.   Plan: 1-Feeding on demand or 8-12 times in 24h period. 2-Use manual pump as needed 3-Encouraged maternal rest, hydration and food intake.   Contact LC as needed for feeds/support/concerns/questions. All questions answered at this time. Provided Lactation services brochure and promoted [employer] booklet information.     Maternal Data Has patient been taught Hand Expression?: Yes Does the patient have breastfeeding experience prior to this delivery?: Yes How long did the patient breastfeed?: <2 weeks due to difficulty latching with nipple shield.  Feeding Mother's Current Feeding Choice: Breast Milk and Donor Milk Nipple Type: Slow - flow  LATCH Score Latch: Repeated attempts needed to sustain latch, nipple held in mouth throughout feeding, stimulation needed to elicit sucking reflex.  Audible Swallowing: None  Type of Nipple: Flat (small, short shafted nipples)  Comfort (Breast/Nipple): Soft / non-tender (dense tissue)  Hold (Positioning): Assistance needed to correctly position infant at breast  and maintain latch.  LATCH Score: 5   Lactation Tools Discussed/Used Tools: Pump;Flanges Flange Size: 21 Breast pump type: Manual Pump Education: Setup, frequency, and cleaning;Milk Storage Reason for Pumping: nipple eversion, stimulation and supplementation Pumping frequency: as needed when attempting latch Pumped volume:  (1 drop)  Interventions Interventions: Breast feeding basics reviewed;Assisted with latch;Skin to skin;Hand express;Pre-pump if needed;Breast compression;Adjust position;Hand pump;LC Services brochure;Pace feeding;Education;Support pillows  Discharge Pump: Manual;Personal  Consult Status Consult Status: Follow-up Date: 09/13/21 Follow-up type: In-patient    Jordan Avery 09/12/2021, 9:03 PM

## 2021-09-13 LAB — CBC
HCT: 34.2 % — ABNORMAL LOW (ref 36.0–46.0)
Hemoglobin: 11.2 g/dL — ABNORMAL LOW (ref 12.0–15.0)
MCH: 27.1 pg (ref 26.0–34.0)
MCHC: 32.7 g/dL (ref 30.0–36.0)
MCV: 82.8 fL (ref 80.0–100.0)
Platelets: 172 10*3/uL (ref 150–400)
RBC: 4.13 MIL/uL (ref 3.87–5.11)
RDW: 14.9 % (ref 11.5–15.5)
WBC: 8.6 10*3/uL (ref 4.0–10.5)
nRBC: 0 % (ref 0.0–0.2)

## 2021-09-13 NOTE — Discharge Summary (Signed)
Postpartum Discharge Summary       Patient Name: Jordan Avery DOB: May 15, 1976 MRN: [id number]  Date of admission: 09/11/2021 Delivery date:09/12/2021  Delivering provider: Huel Cote  Date of discharge: 09/14/2021  Admitting diagnosis: Abnormal antenatal test [O28.9] NSVD (normal spontaneous vaginal delivery) [O80] Intrauterine pregnancy: [redacted]w[redacted]d     Secondary diagnosis:  Principal Problem:   Abnormal antenatal test Active Problems:   NSVD (normal spontaneous vaginal delivery)  Additional problems: Abnormal antenatal testing    Discharge diagnosis: Term Pregnancy Delivered and GDM A2                                              Post partum procedures: none Augmentation: AROM and Pitocin Complications: None  Hospital course: Induction of Labor With Vaginal Delivery   35 y.o. yo [id number] at [redacted]w[redacted]d was admitted to the hospital 09/11/2021 for induction of labor.  Indication for induction:  abnormal biophysical profile, relative oligohydramnios .  FHR tracing looked good throughout labor.  Patient had an uncomplicated labor course as follows: Membrane Rupture Time/Date: 8:32 AM ,09/12/2021   Delivery Method:Vaginal, Spontaneous  Episiotomy: None  Lacerations:  None  Details of delivery can be found in separate delivery note.  Patient had a routine postpartum course. Patient is discharged home 09/14/21.  Newborn Data: Birth date:09/12/2021  Birth time:11:27 AM  Gender:Female  Living status:Living  Apgars:8 ,9  Weight:3600 g     Physical exam  Vitals:   09/13/21 1351 09/13/21 2325 09/14/21 0226 09/14/21 0559  BP: 110/82 124/83 121/83 132/90  Pulse: 70 84 82 94  Resp: 16 16    Temp: 97.8 F (36.6 C) 98.1 F (36.7 C)  97.9 F (36.6 C)  TempSrc: Oral Oral  Axillary  SpO2: 100%   100%  Weight:      Height:       General: alert and cooperative Lochia: appropriate Uterine Fundus: firm  Labs: Lab Results  Component Value Date   WBC 8.6 09/13/2021   HGB  11.2 (L) 09/13/2021   HCT 34.2 (L) 09/13/2021   MCV 82.8 09/13/2021   PLT 172 09/13/2021   CMP Latest Ref Rng & Units 09/11/2021  Glucose 70 - 99 mg/dL 202(R)  BUN 6 - 20 mg/dL 8  Creatinine 4.27 - 0.62 mg/dL 3.76  Sodium 283 - 151 mmol/L 134(L)  Potassium 3.5 - 5.1 mmol/L 3.9  Chloride 98 - 111 mmol/L 106  CO2 22 - 32 mmol/L 19(L)  Calcium 8.9 - 10.3 mg/dL 9.2  Total Protein 6.5 - 8.1 g/dL 6.4(L)  Total Bilirubin 0.3 - 1.2 mg/dL 0.3  Alkaline Phos 38 - 126 U/L 143(H)  AST 15 - 41 U/L 20  ALT 0 - 44 U/L 11   Edinburgh Score: Edinburgh Postnatal Depression Scale Screening Tool 09/13/2021  I have been able to laugh and see the funny side of things. 0  I have looked forward with enjoyment to things. 0  I have blamed myself unnecessarily when things went wrong. 1  I have been anxious or worried for no good reason. 2  I have felt scared or panicky for no good reason. 1  Things have been getting on top of me. 1  I have been so unhappy that I have had difficulty sleeping. 0  I have felt sad or miserable. 0  I have been so unhappy that I have  been crying. 0  The thought of harming myself has occurred to me. 0  Edinburgh Postnatal Depression Scale Total 5     After visit meds:  Allergies as of 09/14/2021   No Known Allergies      Medication List     STOP taking these medications    famotidine 20 MG tablet Commonly known as: PEPCID   ferrous sulfate 325 (65 FE) MG tablet   metFORMIN 1000 MG tablet Commonly known as: GLUCOPHAGE   ondansetron 4 MG disintegrating tablet Commonly known as: Zofran ODT   sucralfate 1 GM/10ML suspension Commonly known as: Carafate       TAKE these medications    ibuprofen 800 MG tablet Commonly known as: ADVIL Take 1 tablet (800 mg total) by mouth every 8 (eight) hours as needed. What changed:  medication strength when to take this reasons to take this   loratadine 10 MG tablet Commonly known as: CLARITIN Take 10 mg by mouth  daily as needed for allergies or rhinitis.   multivitamin with minerals Tabs tablet Take 1 tablet by mouth daily.   prenatal multivitamin Tabs tablet Take 1 tablet by mouth daily at 12 noon.   tetrahydrozoline 0.05 % ophthalmic solution Place 2 drops into both eyes daily as needed (Dry eyes).         Discharge home in stable condition Infant Feeding: Bottle and Breast Infant Disposition:home with mother Discharge instruction: per After Visit Summary and Postpartum booklet. Activity: Advance as tolerated. Pelvic rest for 6 weeks.  Diet: routine diet Future Appointments:No future appointments. Follow up Visit:  Follow-up Information     Farrah Skoda, Valerie Roys, MD. Schedule an appointment as soon as possible for a visit in 6 week(s).   Specialty: Obstetrics and Gynecology Why: postpartum Contact information: [address] [place] [address] [place] [address] [phone]                  Please schedule this patient for a In person postpartum visit in 6 weeks with the following provider: MD.  Delivery mode:  Vaginal, Spontaneous  Anticipated Birth Control:  Unsure   09/14/2021 Carlisle Cater, MD

## 2021-09-13 NOTE — Lactation Note (Addendum)
This note was copied from a baby's chart. 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  Baby is at 2.8% weight loss.  Mom is bottle feeding baby donor breast milk without offering the breast or double pumping.  Mom has a hand pump which she is using occasionally.  LC set up a DEBP and assisted Mom with her first pumping using 21 mm flanges for a good fit.  Talked about the best way to stimulate milk supply.   1- lots of STS 2- offering the breast before supplementation and/or after 3- hand express, breast massage and pump both breasts on initiation setting with DEBP. 4- ask for help with pumping or latching.  Mom states her surgeon told her that he removed a lot of glandular tissue during surgery.  Mom has a DEBP from her insurance company.  Mom not sure the brand.  Recommended renting a [employer] DEBP from gift shop for the strongest stimulation.  Lactation Tools Discussed/Used Tools: Pump;Flanges;Bottle Flange Size: 21 Breast pump type: Double-Electric Breast Pump;Manual Pump Education: Setup, frequency, and cleaning;Milk Storage Reason for Pumping: support milk supply/history of breast reduction Pumping frequency: Encouraged Mom to pump both breasts on initiation setting when baby is supplemented Pumped volume: 0 mL  Interventions Interventions: Breast feeding basics reviewed;Skin to skin;Breast massage;Hand express;Support pillows;DEBP;Hand pump  Discharge Pump: Personal;Refer for rental (Mom is not sure of pump brand)  Consult Status Consult Status: Follow-up Date: 09/14/21 Follow-up type: In-patient    Judee Clara 09/13/2021, 5:16 PM

## 2021-09-13 NOTE — Progress Notes (Signed)
MOB was referred for history of depression/anxiety. * Referral screened out by Clinical Social Worker because none of the following criteria appear to apply: ~ History of anxiety/depression during this pregnancy, or of post-partum depression. ~ Diagnosis of anxiety and/or depression within last 3 years. No concerns noted in OB record.  OR * MOB's symptoms currently being treated with medication and/or therapy.  Please contact the Clinical Social Worker if needs arise, or if MOB requests.  Edinburgh Score 5.  Blaine Hamper, MSW, LCSW Clinical Social Work [phone]

## 2021-09-13 NOTE — Progress Notes (Signed)
Post Partum Day 1 Subjective: up ad lib and voiding  States cramping can be significant and not totally relieved with ibuprofen.  Lochia WNL.  She would like to go home this PM if able  Objective: Blood pressure 104/72, pulse 85, temperature 97.7 F (36.5 C), temperature source Oral, resp. rate 18, height 5\' 7"  (1.702 m), weight 84.8 kg, SpO2 100 %, unknown if currently breastfeeding.  Physical Exam:  General: alert and cooperative Lochia: appropriate Uterine Fundus: firm   Recent Labs    09/11/21 1321 09/13/21 0421  HGB 12.2 11.2*  HCT 36.2 34.2*    Assessment/Plan: Discharge home if baby able to go D/c instructions reviewed. D/w pt circumcision and she desires to proceed.  Reviewed risks and benefits with pt in detail.     LOS: 2 days   09/15/21 09/13/2021, 9:05 AM

## 2021-09-13 NOTE — Anesthesia Postprocedure Evaluation (Signed)
Anesthesia Post Note  Patient: Jordan Avery  Procedure(s) Performed: AN AD HOC LABOR EPIDURAL     Patient location during evaluation: Mother Baby Anesthesia Type: Epidural Level of consciousness: awake Pain management: satisfactory to patient Vital Signs Assessment: post-procedure vital signs reviewed and stable Respiratory status: spontaneous breathing Cardiovascular status: stable Anesthetic complications: no   No notable events documented.  Last Vitals:  Vitals:   09/12/21 2310 09/13/21 0528  BP: 129/80 104/72  Pulse: 91 85  Resp: 18 18  Temp: 36.4 C 36.5 C  SpO2:      Last Pain:  Vitals:   09/13/21 0528  TempSrc: Oral  PainSc:    Pain Goal:                   Cephus Shelling

## 2021-09-14 MED ORDER — IBUPROFEN 800 MG PO TABS: 800.0000 mg | ORAL_TABLET | Freq: Three times a day (TID) | ORAL | 1 refills | Status: AC | PRN

## 2021-09-14 NOTE — Progress Notes (Signed)
POSTPARTUM PROGRESS NOTE  Post Partum Day #2  Subjective:  No acute events overnight.  Pt denies problems with ambulating, voiding or po intake.  She denies nausea or vomiting.  Pain is well controlled.   Lochia Minimal.   Objective: Blood pressure 132/90, pulse 94, temperature 97.9 F (36.6 C), temperature source Axillary, resp. rate 16, height 5\' 7"  (1.702 m), weight 84.8 kg, SpO2 100 %, unknown if currently breastfeeding.  Physical Exam:  General: alert, cooperative and no distress Lochia:normal flow Chest: CTAB Heart: RRR no m/r/g Abdomen: +BS, soft, nontender Uterine Fundus: firm, 2cm below umbilicus Extremities: neg edema, neg calf TTP BL, neg Homans BL  Recent Labs    09/11/21 1321 09/13/21 0421  HGB 12.2 11.2*  HCT 36.2 34.2*    Assessment/Plan:  ASSESSMENT: Jordan Avery is a 35 y.o. 31 s/p SVD @ [redacted]w[redacted]d. PNC c/b GDMA2. AMA.   Discharge home and Breastfeeding 2hr GTT at 6wk pp visit   LOS: 3 days

## 2021-09-26 ENCOUNTER — Telehealth (HOSPITAL_COMMUNITY): Payer: Self-pay | Admitting: *Deleted

## 2021-09-26 NOTE — Telephone Encounter (Signed)
Attempted hospital discharge follow-up call. Female voice answered the phone and then hung up. Deforest Hoyles, RN, 09/26/21, [phone]
